# Patient Record
Sex: Female | Born: 1960 | Race: White | Hispanic: No | Marital: Single | Smoking: Current every day smoker
Health system: Southern US, Community
[De-identification: ages and names within clinical notes are randomized; demographics above are authoritative.]

## PROBLEM LIST (undated history)

## (undated) DIAGNOSIS — T8859XA Other complications of anesthesia, initial encounter: Secondary | ICD-10-CM

## (undated) DIAGNOSIS — Z9889 Other specified postprocedural states: Secondary | ICD-10-CM

## (undated) DIAGNOSIS — M1990 Unspecified osteoarthritis, unspecified site: Secondary | ICD-10-CM

## (undated) DIAGNOSIS — Z8489 Family history of other specified conditions: Secondary | ICD-10-CM

## (undated) DIAGNOSIS — C801 Malignant (primary) neoplasm, unspecified: Secondary | ICD-10-CM

## (undated) DIAGNOSIS — I10 Essential (primary) hypertension: Secondary | ICD-10-CM

## (undated) HISTORY — PX: ABDOMINAL HYSTERECTOMY: SHX81

## (undated) HISTORY — PX: ROTATOR CUFF REPAIR: SHX139

---

## 1998-06-12 ENCOUNTER — Ambulatory Visit (HOSPITAL_COMMUNITY): Admission: RE | Admit: 1998-06-12 | Discharge: 1998-06-12 | Payer: Self-pay | Admitting: Urology

## 1999-03-06 ENCOUNTER — Other Ambulatory Visit: Admission: RE | Admit: 1999-03-06 | Discharge: 1999-03-06 | Payer: Self-pay | Admitting: Obstetrics and Gynecology

## 1999-10-08 ENCOUNTER — Other Ambulatory Visit: Admission: RE | Admit: 1999-10-08 | Discharge: 1999-10-08 | Payer: Self-pay | Admitting: Obstetrics and Gynecology

## 2000-04-11 ENCOUNTER — Emergency Department (HOSPITAL_COMMUNITY): Admission: EM | Admit: 2000-04-11 | Discharge: 2000-04-11 | Payer: Self-pay | Admitting: Emergency Medicine

## 2000-04-11 ENCOUNTER — Encounter: Payer: Self-pay | Admitting: Emergency Medicine

## 2000-10-11 ENCOUNTER — Other Ambulatory Visit: Admission: RE | Admit: 2000-10-11 | Discharge: 2000-10-11 | Payer: Self-pay | Admitting: Obstetrics and Gynecology

## 2001-11-17 ENCOUNTER — Other Ambulatory Visit: Admission: RE | Admit: 2001-11-17 | Discharge: 2001-11-17 | Payer: Self-pay | Admitting: Obstetrics and Gynecology

## 2002-06-15 ENCOUNTER — Encounter: Payer: Self-pay | Admitting: Emergency Medicine

## 2002-06-15 ENCOUNTER — Emergency Department (HOSPITAL_COMMUNITY): Admission: EM | Admit: 2002-06-15 | Discharge: 2002-06-15 | Payer: Self-pay | Admitting: Emergency Medicine

## 2002-12-05 ENCOUNTER — Other Ambulatory Visit: Admission: RE | Admit: 2002-12-05 | Discharge: 2002-12-05 | Payer: Self-pay | Admitting: Obstetrics and Gynecology

## 2005-09-21 ENCOUNTER — Other Ambulatory Visit: Admission: RE | Admit: 2005-09-21 | Discharge: 2005-09-21 | Payer: Self-pay | Admitting: Family Medicine

## 2006-08-23 ENCOUNTER — Ambulatory Visit: Payer: Self-pay | Admitting: Family Medicine

## 2006-12-13 ENCOUNTER — Ambulatory Visit: Payer: Self-pay | Admitting: Family Medicine

## 2007-12-02 ENCOUNTER — Emergency Department (HOSPITAL_COMMUNITY): Admission: EM | Admit: 2007-12-02 | Discharge: 2007-12-02 | Payer: Self-pay | Admitting: Emergency Medicine

## 2008-10-02 ENCOUNTER — Ambulatory Visit: Payer: Self-pay | Admitting: Family Medicine

## 2009-07-03 ENCOUNTER — Ambulatory Visit: Payer: Self-pay | Admitting: Family Medicine

## 2009-08-19 ENCOUNTER — Encounter: Admission: RE | Admit: 2009-08-19 | Discharge: 2009-08-19 | Payer: Self-pay | Admitting: Family Medicine

## 2009-08-19 ENCOUNTER — Ambulatory Visit: Payer: Self-pay | Admitting: Family Medicine

## 2010-01-03 IMAGING — CR DG CHEST 2V
2 series · 2 of 2 positions shown · non-contrast
Comparison: None.

CLINICAL DATA: Cough and fever.

CHEST - 2 VIEW

[w chest pa *]
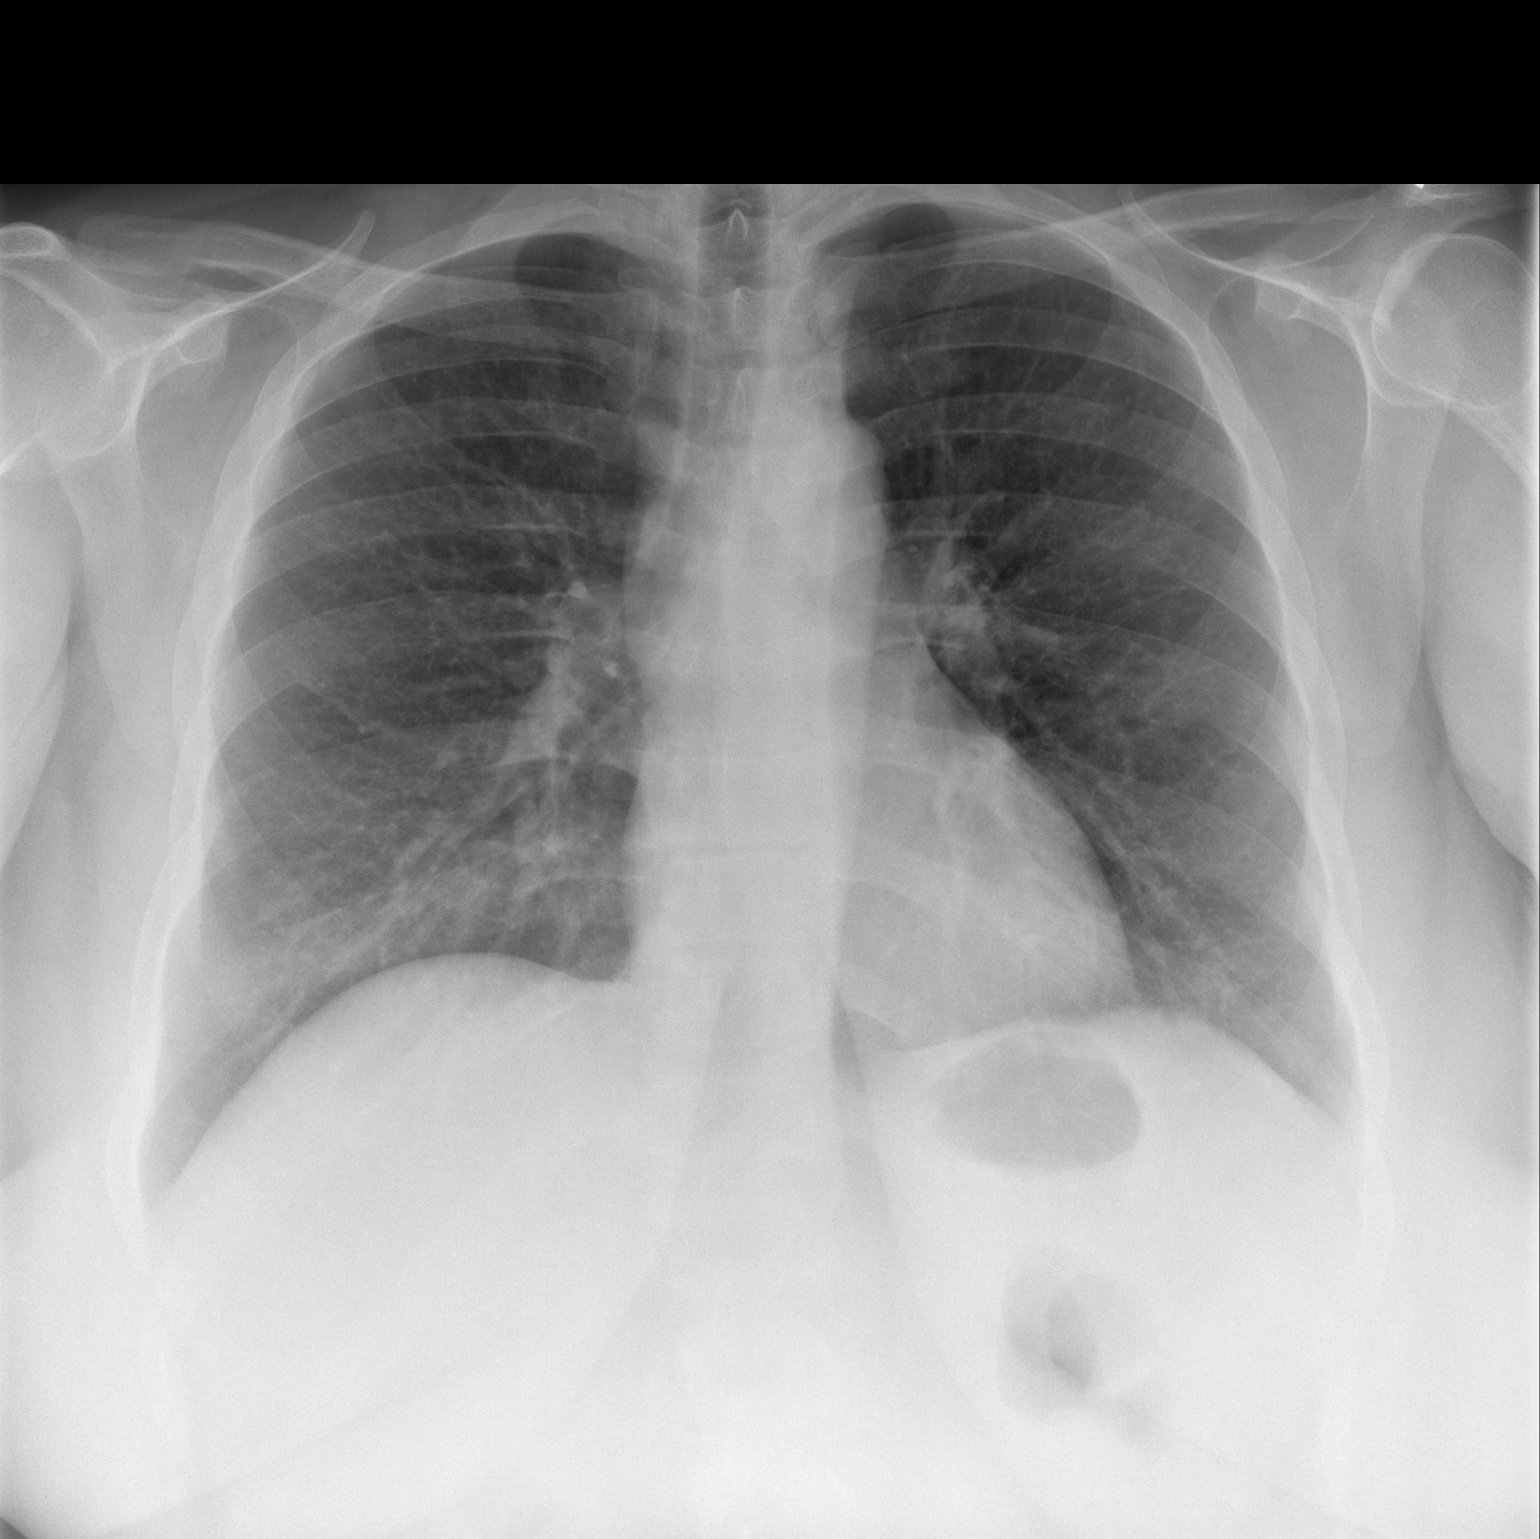

[w chest lat]
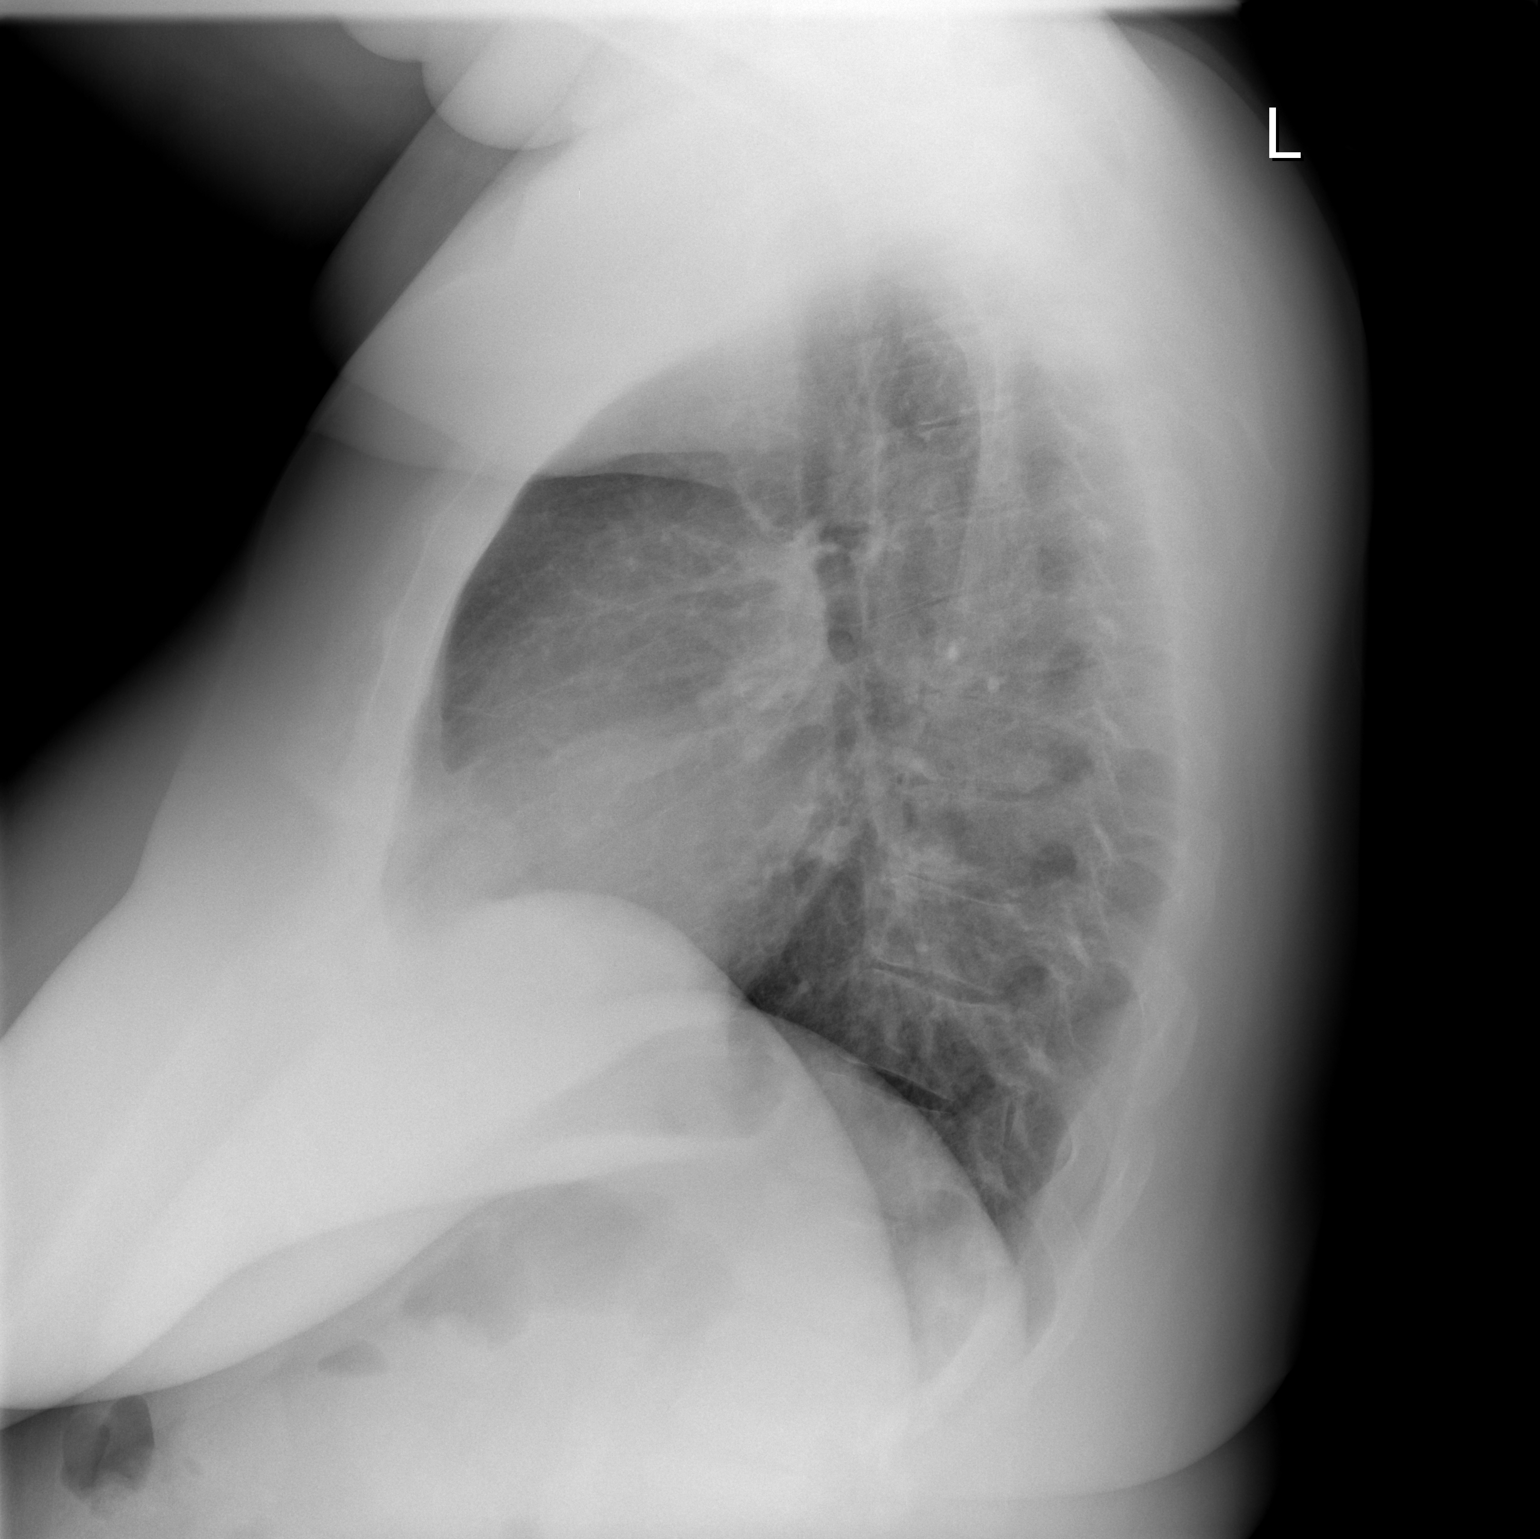

[2 of 2 positions shown; findings below may reference images not displayed]

FINDINGS: The lungs are clear without focal infiltrate, edema,
pneumothorax or pleural effusion. Interstitial markings are
diffusely coarsened with chronic features. The cardiopericardial
silhouette is within normal limits for size. Imaged bony structures
of the thorax are intact.
IMPRESSION: No acute cardiopulmonary process.

## 2011-01-19 ENCOUNTER — Encounter: Payer: Self-pay | Admitting: Family Medicine

## 2013-06-13 ENCOUNTER — Ambulatory Visit (INDEPENDENT_AMBULATORY_CARE_PROVIDER_SITE_OTHER): Payer: Self-pay | Admitting: Medical

## 2013-06-13 ENCOUNTER — Encounter: Payer: Self-pay | Admitting: Medical

## 2013-06-13 VITALS — BP 120/70 | HR 92 | Temp 98.4°F | Resp 18 | Wt 302.0 lb

## 2013-06-13 DIAGNOSIS — J029 Acute pharyngitis, unspecified: Secondary | ICD-10-CM

## 2013-06-13 DIAGNOSIS — E669 Obesity, unspecified: Secondary | ICD-10-CM

## 2013-06-13 DIAGNOSIS — B370 Candidal stomatitis: Secondary | ICD-10-CM

## 2013-06-13 MED ORDER — FLUCONAZOLE 100 MG PO TABS: 100.0000 mg | ORAL_TABLET | Freq: Every day | ORAL | Status: DC

## 2013-06-13 NOTE — Progress Notes (Signed)
Subjective: Here for 1 day hx/o blisters on tongue, white patches on back of throat, lymph nodes in neck tender.  denies sick contacts, no fever, no cough, no runny nose, no sneezing, no ear pain, no rash.   Denies hx/o HIV, diabetes, and says she does check her glucose every now and then with mother's glucometer and gets normal numbers.   Tongue feels like a burning sensation.   Using salt water gargles.  Objective: Filed Vitals:   06/13/13 1431  BP: 120/70  Pulse: 92  Temp: 98.4 F (36.9 C)  Resp: 18    General appearance: alert, no distress, WD/WN, obese white female HEENT: normocephalic, sclerae anicteric, TMs pearly, nares patent, no discharge or erythema Oral cavity: MMM, +tongue and pharynx with white patches, tender and somewhat erythematous tongue, all suggestive of thrush Neck: supple, no lymphadenopathy, no thyromegaly, no masses  Assessment: Encounter Diagnoses  Name Primary?  Tara Gregory Yes  . Pharyngitis   . Obesity, unspecified      Plan: discussed concern for underlying causes of thrush.  Recommended some baseline testing but she declines . Begin diflucan, can use salt water gargles, and if not improved in 1 wk then f/u.  In general advised she return for baseline testing to screen for HIV, diabetes, and underlying risks for thrush.

## 2013-06-14 ENCOUNTER — Telehealth: Payer: Self-pay | Admitting: Family Medicine

## 2013-06-14 LAB — POCT RAPID STREP A (OFFICE): Rapid Strep A Screen: NEGATIVE

## 2013-06-14 NOTE — Telephone Encounter (Signed)
I called and I talk with the patient and she was on her way to the pharmacy to pick up her medication. CLS

## 2013-06-14 NOTE — Telephone Encounter (Signed)
Message copied by Janeice Robinson on Wed Jun 14, 2013 11:11 AM ------      Message from: Jac Canavan      Created: Tue Jun 13, 2013  9:59 PM       Call and make sure pt got the diflucan ------

## 2013-11-20 ENCOUNTER — Other Ambulatory Visit (INDEPENDENT_AMBULATORY_CARE_PROVIDER_SITE_OTHER): Payer: Self-pay

## 2013-11-20 DIAGNOSIS — Z111 Encounter for screening for respiratory tuberculosis: Secondary | ICD-10-CM

## 2013-11-22 LAB — TB SKIN TEST
Induration: 0 mm
TB Skin Test: NEGATIVE

## 2023-03-10 ENCOUNTER — Other Ambulatory Visit: Payer: Self-pay

## 2023-03-10 ENCOUNTER — Encounter (HOSPITAL_BASED_OUTPATIENT_CLINIC_OR_DEPARTMENT_OTHER): Payer: Self-pay | Admitting: Emergency Medicine

## 2023-03-10 ENCOUNTER — Emergency Department (HOSPITAL_BASED_OUTPATIENT_CLINIC_OR_DEPARTMENT_OTHER)
Admission: EM | Admit: 2023-03-10 | Discharge: 2023-03-10 | Disposition: A | Discharge status: Home / Self Care | Payer: Self-pay | Attending: Emergency Medicine | Admitting: Emergency Medicine

## 2023-03-10 DIAGNOSIS — H1033 Unspecified acute conjunctivitis, bilateral: Secondary | ICD-10-CM | POA: Insufficient documentation

## 2023-03-10 MED ORDER — FLUORESCEIN SODIUM 1 MG OP STRP
1.0000 | ORAL_STRIP | Freq: Once | OPHTHALMIC | Status: AC
  Administered 2023-03-10: 1 via OPHTHALMIC
  Filled 2023-03-10: qty 1

## 2023-03-10 MED ORDER — ERYTHROMYCIN 5 MG/GM OP OINT
TOPICAL_OINTMENT | Freq: Four times a day (QID) | OPHTHALMIC | Status: DC
  Filled 2023-03-10: qty 3.5

## 2023-03-10 MED ORDER — TETRACAINE HCL 0.5 % OP SOLN
1.0000 [drp] | Freq: Once | OPHTHALMIC | Status: AC
  Administered 2023-03-10: 1 [drp] via OPHTHALMIC
  Filled 2023-03-10: qty 4

## 2023-03-10 MED ORDER — LEVOCETIRIZINE DIHYDROCHLORIDE 5 MG PO TABS: 5.0000 mg | ORAL_TABLET | Freq: Every evening | ORAL | 0 refills | Status: DC

## 2023-03-10 NOTE — ED Triage Notes (Signed)
Pt via pov from home with what appears to be conjunctivitis. She has bilateral red sclera with discharge x 3 weeks, progressively worsening. She states she has been using allergy drops and it seems to get better then worse again. She reports the last 2 days have been the worst. Pt alert & oriented, nad noted.

## 2023-03-10 NOTE — ED Notes (Signed)
Reviewed AVS/discharge instruction with patient. Time allotted for and all questions answered. Patient is agreeable for d/c and escorted to ed exit by staff.  

## 2023-03-10 NOTE — Discharge Instructions (Signed)
Seen today for eye redness and pain.  You do have some decreased vision in the right.  We have given you some antibiotic drops.  Please call the eye doctor today to get close follow-up appointment and more detailed eye exam.  There is no sign of a corneal abrasion or foreign body on your exam.  Come back to the ER for any new or worsening symptoms.

## 2023-03-10 NOTE — ED Provider Notes (Signed)
[facility] Provider Note   CSN: [id number] Arrival date & time: 03/10/23  1403     History  Chief Complaint  Patient presents with   Eye Pain    Tara Gregory is a 62 y.o. female.  She has history of seasonal allergies but denies other past medical history. She presents today for bilateral eye redness for 3 weeks with watery drainage.  She comes in today because she states that she had been managing her symptoms with artificial tears and allergy drops but now she has a gritty feeling in the right eye with some sensitivity to light and had some white drainage from the right eye today as well.  She denies any vision changes.  She does not wear contact lenses, reading glasses only.  She has not seen an eye doctor or other provider for this problem.  She denies fevers or chills.  Eye Pain       Home Medications Prior to Admission medications   Medication Sig Start Date End Date Taking? Authorizing Provider  levocetirizine (XYZAL) 5 MG tablet Take 1 tablet (5 mg total) by mouth every evening. 03/10/23  Yes Quandarius Nill A, PA-C  fluconazole (DIFLUCAN) 100 MG tablet Take 1 tablet (100 mg total) by mouth daily. 06/13/13   Tysinger, Camelia Eng, PA-C      Allergies    Cortisone, Demerol [meperidine hcl], Grapefruit concentrate, and Penicillins    Review of Systems   Review of Systems  Eyes:  Positive for pain.    Physical Exam Updated Vital Signs BP (!) 153/82 (BP Location: Right Arm)   Pulse 66   Temp 98 F (36.7 C) (Oral)   Resp 18   Ht '5\' 10"'$  (1.778 m)   Wt 95.3 kg   SpO2 100%   BMI 30.13 kg/m  Physical Exam Vitals and nursing note reviewed.  Constitutional:      General: She is not in acute distress.    Appearance: She is well-developed.  HENT:     Head: Normocephalic and atraumatic.     Mouth/Throat:     Mouth: Mucous membranes are moist.  Eyes:     General: Lids are everted, no foreign bodies appreciated. No visual  field deficit.       Right eye: No foreign body, discharge or hordeolum.        Left eye: No foreign body, discharge or hordeolum.     Intraocular pressure: Right eye pressure is 20 mmHg. Left eye pressure is 18 mmHg.     Extraocular Movements:     Right eye: Normal extraocular motion and no nystagmus.     Left eye: Normal extraocular motion and no nystagmus.     Conjunctiva/sclera:     Right eye: Right conjunctiva is injected. No chemosis, exudate or hemorrhage.    Left eye: Left conjunctiva is injected. No chemosis, exudate or hemorrhage.    Pupils: Pupils are equal, round, and reactive to light.     Slit lamp exam:    Right eye: Photophobia present.     Left eye: No photophobia.     Comments: Woods lamp used with fluorescein stain. No FB or abrasion of cornea, no ulcer or dendritic lesions  Cardiovascular:     Rate and Rhythm: Normal rate and regular rhythm.     Heart sounds: No murmur heard. Pulmonary:     Effort: Pulmonary effort is normal. No respiratory distress.     Breath sounds: Normal breath sounds.  Abdominal:     Palpations: Abdomen is soft.     Tenderness: There is no abdominal tenderness.  Musculoskeletal:        General: No swelling.     Cervical back: Neck supple.  Skin:    General: Skin is warm and dry.     Capillary Refill: Capillary refill takes less than 2 seconds.  Neurological:     Mental Status: She is alert.  Psychiatric:        Mood and Affect: Mood normal.     ED Results / Procedures / Treatments   Labs (all labs ordered are listed, but only abnormal results are displayed) Labs Reviewed - No data to display  EKG None  Radiology No results found.  Procedures Procedures    Medications Ordered in ED Medications  fluorescein ophthalmic strip 1 strip (has no administration in time range)  tetracaine (PONTOCAINE) 0.5 % ophthalmic solution 1 drop (has no administration in time range)  erythromycin ophthalmic ointment (has no administration  in time range)    ED Course/ Medical Decision Making/ A&P                             Medical Decision Making This patient presents to the ED for concern of bilateral eye redness with drainage from right eye and right eye "gritty sensation" worsening X 3 weeks this involves an extensive number of treatment options, and is a complaint that carries with it a high risk of complications and morbidity.  The differential diagnosis includes conjunctivitis,         Problem List / ED Course / Critical interventions / Medication management  Bilateral eye redness with gritty sensation in the right eye going on for 3 weeks getting worse not resolved with OTC allergy drops.  Fluorescein stain did not show foreign body, abrasion, dendritic lesion or ulceration.  Patient does not wear contact lenses or corrective lenses, has not seen an eye doctor in a long time.  I suspect patient has allergic conjunctivitis that has worsened and now has some superimposed bacterial conjunctivitis.  Prescribed oral antihistamines, advised on ophthalmology follow-up.  She states she prefers to see her daughters ophthalmologist Dr. Katy Fitch, she was given his contact information and advised on close follow-up.  Of note she had normal eye pressures, do not suspect glaucoma, visual acuity is slightly decreased in the right eye.  She does not have consensual photophobia to suggest that she has acute iritis.  I ordered medication including tetracaine for topical anesthesia Reevaluation of the patient after these medicines showed that the patient resolved I have reviewed the patients home medicines and have made adjustments as needed       Risk Prescription drug management.           Final Clinical Impression(s) / ED Diagnoses Final diagnoses:  Acute conjunctivitis of both eyes, unspecified acute conjunctivitis type    Rx / DC Orders ED Discharge Orders          Ordered    levocetirizine (XYZAL) 5 MG tablet   Every evening        03/10/23 1618              Gwenevere Abbot, PA-C 03/10/23 1621    Jeanell Sparrow, DO 03/11/23 0031    Jeanell Sparrow, DO 03/11/23 0032

## 2024-05-19 NOTE — Progress Notes (Signed)
 Surgery orders requested via Epic inbox.

## 2024-05-24 NOTE — H&P (Signed)
 Patient's anticipated LOS is less than 2 midnights, meeting these requirements: - Younger than 34 - Lives within 1 hour of care - Has a competent adult at home to recover with post-op recover - NO history of  - Chronic pain requiring opiods  - Diabetes  - Coronary Artery Disease  - Heart failure  - Heart attack  - Stroke  - DVT/VTE  - Cardiac arrhythmia  - Respiratory Failure/COPD  - Renal failure  - Anemia  - Advanced Liver disease     Tara Gregory is an 63 y.o. female.    Chief Complaint: right shoulder pain  HPI: Pt is a 63 y.o. female complaining of right shoulder pain for multiple years. Pain had continually increased since the beginning. X-rays in the clinic show end-stage arthritic changes of the right shoulder. Pt has tried various conservative treatments which have failed to alleviate their symptoms, including injections and therapy. Various options are discussed with the patient. Risks, benefits and expectations were discussed with the patient. Patient understand the risks, benefits and expectations and wishes to proceed with surgery.   PCP:  No primary care provider on file.  D/C Plans: Home  PMH: No past medical history on file.  PSH: Past Surgical History:  Procedure Laterality Date   ABDOMINAL HYSTERECTOMY      Social History:  reports that she has been smoking cigarettes. She does not have any smokeless tobacco history on file. She reports that she does not currently use alcohol. No history on file for drug use. BMI: Estimated body mass index is 30.13 kg/m as calculated from the following:   Height as of 03/10/23: 5\' 10"  (1.778 m).   Weight as of 03/10/23: 95.3 kg.  No results found for: "ALBUMIN" Diabetes: Patient does not have a diagnosis of diabetes.     Smoking Status:   reports that she has been smoking cigarettes. She does not have any smokeless tobacco history on file.    Allergies:  Allergies  Allergen Reactions   Cortisone      Cortisone shot - throat swollen up   Demerol [Meperidine Hcl] Nausea And Vomiting    Severe vomiting   Grapefruit Concentrate     Grapefruits - hives   Penicillins Hives    Medications: No current facility-administered medications for this encounter.   Current Outpatient Medications  Medication Sig Dispense Refill   acetaminophen (TYLENOL) 650 MG CR tablet Take 650-1,300 mg by mouth every 8 (eight) hours as needed for pain.     amLODipine (NORVASC) 5 MG tablet Take 5 mg by mouth in the morning.     Ascorbic Acid (VITAMIN C PO) Take 1,000 mg by mouth in the morning and at bedtime.     cetirizine (ZYRTEC) 10 MG tablet Take 10 mg by mouth at bedtime.     methocarbamol (ROBAXIN) 500 MG tablet Take 500 mg by mouth 2 (two) times daily as needed for muscle spasms.     Olopatadine HCl (PATADAY OP) Place 1 drop into both eyes at bedtime.     Povidone, PF, (IVIZIA DRY EYES) 0.5 % SOLN Place 1 drop into both eyes in the morning, at noon, in the evening, and at bedtime.     rosuvastatin (CRESTOR) 5 MG tablet Take 5 mg by mouth every evening.     traMADol (ULTRAM) 50 MG tablet Take 25 mg by mouth daily as needed (pain).      No results found for this or any previous visit (from the past 48  hours). No results found.  ROS: Pain with rom of the right upper extremity  Physical Exam: Alert and oriented 63 y.o. female in no acute distress Cranial nerves 2-12 intact Cervical spine: full rom with no tenderness, nv intact distally Chest: active breath sounds bilaterally, no wheeze rhonchi or rales Heart: regular rate and rhythm, no murmur Abd: non tender non distended with active bowel sounds Hip is stable with rom  Right shoulder painful and weak rom Nv intact distally No rashes or edema distally  Assessment/Plan Assessment: right shoulder cuff arthropathy  Plan:  Patient will undergo a right reverse total shoulder by Dr. Brunilda Capra at [place] Risks benefits and expectations were discussed with  the patient. Patient understand risks, benefits and expectations and wishes to proceed. Preoperative templating of the joint replacement has been completed, documented, and submitted to the Operating Room personnel in order to optimize intra-operative equipment management.   Lorina Roosevelt PA-C, [facility] is now [employer] [address], [address], [place], [place] [address] Phone: [phone] [email or web address] Facebook  [employer]

## 2024-05-25 NOTE — Patient Instructions (Signed)
 SURGICAL WAITING ROOM VISITATION  Patients having surgery or a procedure may have no more than 2 support people in the waiting area - these visitors may rotate.    Children under the age of 63 must have an adult with them who is not the patient.  Due to an increase in RSV and influenza rates and associated hospitalizations, children ages 65 and under may not visit patients in [facility] hospitals.  Visitors with respiratory illnesses are discouraged from visiting and should remain at home.  If the patient needs to stay at the hospital during part of their recovery, the visitor guidelines for inpatient rooms apply. Pre-op nurse will coordinate an appropriate time for 1 support person to accompany patient in pre-op.  This support person may not rotate.    Please refer to the [facility] website for the visitor guidelines for Inpatients (after your surgery is over and you are in a regular room).       Your procedure is scheduled on: 06/01/24   Report to [facility] Main Entrance    Report to admitting at : 5:15 AM   Call this number if you have problems the morning of surgery [phone]   Do not eat food :After Midnight.   After Midnight you may have the following liquids until : 4:30 AM DAY OF SURGERY  Water Non-Citrus Juices (without pulp, NO RED-Apple, White grape, White cranberry) Black Coffee (NO MILK/CREAM OR CREAMERS, sugar ok)  Clear Tea (NO MILK/CREAM OR CREAMERS, sugar ok) regular and decaf                             Plain Jell-O (NO RED)                                           Fruit ices (not with fruit pulp, NO RED)                                     Popsicles (NO RED)                                                               Sports drinks like Gatorade (NO RED)   The day of surgery:  Drink ONE (1) Pre-Surgery Clear Ensure at : 4:30 AM the morning of surgery. Drink in one sitting. Do not sip.  This drink was given to you during your hospital  pre-op  appointment visit. Nothing else to drink after completing the  Pre-Surgery Clear Ensure or G2.          If you have questions, please contact your surgeon's office.  FOLLOW ANY ADDITIONAL PRE OP INSTRUCTIONS YOU RECEIVED FROM YOUR SURGEON'S OFFICE!!!   Oral Hygiene is also important to reduce your risk of infection.                                    Remember - BRUSH YOUR TEETH THE MORNING OF SURGERY WITH YOUR REGULAR TOOTHPASTE  DENTURES WILL BE REMOVED PRIOR TO SURGERY PLEASE DO NOT APPLY "Poly grip" OR ADHESIVES!!!   Do NOT smoke after Midnight   Stop all vitamins and herbal supplements 7 days before surgery.   Take these medicines the morning of surgery with A SIP OF WATER: amlodipine.Tylenol as needed.                              You may not have any metal on your body including hair pins, jewelry, and body piercing             Do not wear make-up, lotions, powders, perfumes/cologne, or deodorant  Do not wear nail polish including gel and S&S, artificial/acrylic nails, or any other type of covering on natural nails including finger and toenails. If you have artificial nails, gel coating, etc. that needs to be removed by a nail salon please have this removed prior to surgery or surgery may need to be canceled/ delayed if the surgeon/ anesthesia feels like they are unable to be safely monitored.   Do not shave  48 hours prior to surgery.    Do not bring valuables to the hospital. [facility] IS NOT             RESPONSIBLE   FOR VALUABLES.   Contacts, glasses, dentures or bridgework may not be worn into surgery.   Bring small overnight bag day of surgery.   DO NOT BRING YOUR HOME MEDICATIONS TO THE HOSPITAL. PHARMACY WILL DISPENSE MEDICATIONS LISTED ON YOUR MEDICATION LIST TO YOU DURING YOUR ADMISSION IN THE HOSPITAL!    Patients discharged on the day of surgery will not be allowed to drive home.  Someone NEEDS to stay with you for the first 24 hours after  anesthesia.   Special Instructions: Bring a copy of your healthcare power of attorney and living will documents the day of surgery if you haven't scanned them before.              Please read over the following fact sheets you were given: IF YOU HAVE QUESTIONS ABOUT YOUR PRE-OP INSTRUCTIONS PLEASE CALL [phone]   If you received a COVID test during your pre-op visit  it is requested that you wear a mask when out in public, stay away from anyone that may not be feeling well and notify your surgeon if you develop symptoms. If you test positive for Covid or have been in contact with anyone that has tested positive in the last 10 days please notify you surgeon. [facility]- Preparing for Total Shoulder Arthroplasty    Before surgery, you can play an important role. Because skin is not sterile, your skin needs to be as free of germs as possible. You can reduce the number of germs on your skin by using the following products. Benzoyl Peroxide Gel Reduces the number of germs present on the skin Applied twice a day to shoulder area starting two days before surgery    ==================================================================  Please follow these instructions carefully:  BENZOYL PEROXIDE 5% GEL  Please do not use if you have an allergy to benzoyl peroxide.   If your skin becomes reddened/irritated stop using the benzoyl peroxide.  Starting two days before surgery, apply as follows: Apply benzoyl peroxide in the morning and at night. Apply after taking a shower. If you are not taking a shower clean entire shoulder front, back, and side along with the armpit with a clean wet washcloth.  Place a quarter-sized dollop on your shoulder and rub in thoroughly, making sure to cover the front, back, and side of your shoulder, along with the armpit.   2 days before ____ AM   ____ PM              1 day before ____ AM   ____ PM                         Do this twice a day for two days.  (Last  application is the night before surgery, AFTER using the CHG soap as described below).  Do NOT apply benzoyl peroxide gel on the day of surgery.     Pre-operative 5 CHG Bath Instructions   You can play a key role in reducing the risk of infection after surgery. Your skin needs to be as free of germs as possible. You can reduce the number of germs on your skin by washing with CHG (chlorhexidine gluconate) soap before surgery. CHG is an antiseptic soap that kills germs and continues to kill germs even after washing.   DO NOT use if you have an allergy to chlorhexidine/CHG or antibacterial soaps. If your skin becomes reddened or irritated, stop using the CHG and notify one of our RNs at [phone].   Please shower with the CHG soap starting 4 days before surgery using the following schedule:     Please keep in mind the following:  DO NOT shave, including legs and underarms, starting the day of your first shower.   You may shave your face at any point before/day of surgery.  Place clean sheets on your bed the day you start using CHG soap. Use a clean washcloth (not used since being washed) for each shower. DO NOT sleep with pets once you start using the CHG.   CHG Shower Instructions:  If you choose to wash your hair and private area, wash first with your normal shampoo/soap.  After you use shampoo/soap, rinse your hair and body thoroughly to remove shampoo/soap residue.  Turn the water OFF and apply about 3 tablespoons (45 ml) of CHG soap to a CLEAN washcloth.  Apply CHG soap ONLY FROM YOUR NECK DOWN TO YOUR TOES (washing for 3-5 minutes)  DO NOT use CHG soap on face, private areas, open wounds, or sores.  Pay special attention to the area where your surgery is being performed.  If you are having back surgery, having someone wash your back for you may be helpful. Wait 2 minutes after CHG soap is applied, then you may rinse off the CHG soap.  Pat dry with a clean towel  Put on clean  clothes/pajamas   If you choose to wear lotion, please use ONLY the CHG-compatible lotions on the back of this paper.     Additional instructions for the day of surgery: DO NOT APPLY any lotions, deodorants, cologne, or perfumes.   Put on clean/comfortable clothes.  Brush your teeth.  Ask your nurse before applying any prescription medications to the skin.   CHG Compatible Lotions   Aveeno Moisturizing lotion  Cetaphil Moisturizing Cream  Cetaphil Moisturizing Lotion  Clairol Herbal Essence Moisturizing Lotion, Dry Skin  Clairol Herbal Essence Moisturizing Lotion, Extra Dry Skin  Clairol Herbal Essence Moisturizing Lotion, Normal Skin  Curel Age Defying Therapeutic Moisturizing Lotion with Alpha Hydroxy  Curel Extreme Care Body Lotion  Curel Soothing Hands Moisturizing Hand Lotion  Curel Therapeutic Moisturizing Cream, Fragrance-Free  Curel Therapeutic  Moisturizing Lotion, Fragrance-Free  Curel Therapeutic Moisturizing Lotion, Original Formula  Eucerin Daily Replenishing Lotion  Eucerin Dry Skin Therapy Plus Alpha Hydroxy Crme  Eucerin Dry Skin Therapy Plus Alpha Hydroxy Lotion  Eucerin Original Crme  Eucerin Original Lotion  Eucerin Plus Crme Eucerin Plus Lotion  Eucerin TriLipid Replenishing Lotion  Keri Anti-Bacterial Hand Lotion  Keri Deep Conditioning Original Lotion Dry Skin Formula Softly Scented  Keri Deep Conditioning Original Lotion, Fragrance Free Sensitive Skin Formula  Keri Lotion Fast Absorbing Fragrance Free Sensitive Skin Formula  Keri Lotion Fast Absorbing Softly Scented Dry Skin Formula  Keri Original Lotion  Keri Skin Renewal Lotion Keri Silky Smooth Lotion  Keri Silky Smooth Sensitive Skin Lotion  Nivea Body Creamy Conditioning Oil  Nivea Body Extra Enriched Lotion  Nivea Body Original Lotion  Nivea Body Sheer Moisturizing Lotion Nivea Crme  Nivea Skin Firming Lotion  NutraDerm 30 Skin Lotion  NutraDerm Skin Lotion  NutraDerm Therapeutic Skin  Cream  NutraDerm Therapeutic Skin Lotion  ProShield Protective Hand Cream  Provon moisturizing lotion   Incentive Spirometer  An incentive spirometer is a tool that can help keep your lungs clear and active. This tool measures how well you are filling your lungs with each breath. Taking long deep breaths may help reverse or decrease the chance of developing breathing (pulmonary) problems (especially infection) following: A long period of time when you are unable to move or be active. BEFORE THE PROCEDURE  If the spirometer includes an indicator to show your best effort, your nurse or respiratory therapist will set it to a desired goal. If possible, sit up straight or lean slightly forward. Try not to slouch. Hold the incentive spirometer in an upright position. INSTRUCTIONS FOR USE  Sit on the edge of your bed if possible, or sit up as far as you can in bed or on a chair. Hold the incentive spirometer in an upright position. Breathe out normally. Place the mouthpiece in your mouth and seal your lips tightly around it. Breathe in slowly and as deeply as possible, raising the piston or the ball toward the top of the column. Hold your breath for 3-5 seconds or for as long as possible. Allow the piston or ball to fall to the bottom of the column. Remove the mouthpiece from your mouth and breathe out normally. Rest for a few seconds and repeat Steps 1 through 7 at least 10 times every 1-2 hours when you are awake. Take your time and take a few normal breaths between deep breaths. The spirometer may include an indicator to show your best effort. Use the indicator as a goal to work toward during each repetition. After each set of 10 deep breaths, practice coughing to be sure your lungs are clear. If you have an incision (the cut made at the time of surgery), support your incision when coughing by placing a pillow or rolled up towels firmly against it. Once you are able to get out of bed, walk around  indoors and cough well. You may stop using the incentive spirometer when instructed by your caregiver.  RISKS AND COMPLICATIONS Take your time so you do not get dizzy or light-headed. If you are in pain, you may need to take or ask for pain medication before doing incentive spirometry. It is harder to take a deep breath if you are having pain. AFTER USE Rest and breathe slowly and easily. It can be helpful to keep track of a log of your progress. Your caregiver can  provide you with a simple table to help with this. If you are using the spirometer at home, follow these instructions: SEEK MEDICAL CARE IF:  You are having difficultly using the spirometer. You have trouble using the spirometer as often as instructed. Your pain medication is not giving enough relief while using the spirometer. You develop fever of 100.5 F (38.1 C) or higher. SEEK IMMEDIATE MEDICAL CARE IF:  You cough up bloody sputum that had not been present before. You develop fever of 102 F (38.9 C) or greater. You develop worsening pain at or near the incision site. MAKE SURE YOU:  Understand these instructions. Will watch your condition. Will get help right away if you are not doing well or get worse. Document Released: 04/26/2007 Document Revised: 03/07/2012 Document Reviewed: 06/27/2007 [facility] Patient Information 2014 [place], [place].   ________________________________________________________________________

## 2024-05-26 ENCOUNTER — Encounter (HOSPITAL_COMMUNITY): Payer: Self-pay

## 2024-05-26 ENCOUNTER — Other Ambulatory Visit: Payer: Self-pay

## 2024-05-26 ENCOUNTER — Encounter (HOSPITAL_COMMUNITY)
Admission: RE | Admit: 2024-05-26 | Discharge: 2024-05-26 | Disposition: A | Discharge status: Home / Self Care | Payer: Self-pay | Source: Ambulatory Visit | Attending: Orthopedic Surgery | Admitting: Orthopedic Surgery

## 2024-05-26 VITALS — BP 159/93 | HR 65 | Temp 97.9°F | Ht 68.0 in | Wt 302.0 lb

## 2024-05-26 DIAGNOSIS — R9431 Abnormal electrocardiogram [ECG] [EKG]: Secondary | ICD-10-CM | POA: Insufficient documentation

## 2024-05-26 DIAGNOSIS — Z01818 Encounter for other preprocedural examination: Secondary | ICD-10-CM | POA: Insufficient documentation

## 2024-05-26 DIAGNOSIS — I10 Essential (primary) hypertension: Secondary | ICD-10-CM

## 2024-05-26 HISTORY — DX: Unspecified osteoarthritis, unspecified site: M19.90

## 2024-05-26 HISTORY — DX: Family history of other specified conditions: Z84.89

## 2024-05-26 HISTORY — DX: Essential (primary) hypertension: I10

## 2024-05-26 HISTORY — DX: Other complications of anesthesia, initial encounter: T88.59XA

## 2024-05-26 HISTORY — DX: Nausea with vomiting, unspecified: Z98.890

## 2024-05-26 HISTORY — DX: Malignant (primary) neoplasm, unspecified: C80.1

## 2024-05-26 LAB — SURGICAL PCR SCREEN
MRSA, PCR: NEGATIVE
Staphylococcus aureus: NEGATIVE

## 2024-05-26 LAB — BASIC METABOLIC PANEL WITH GFR
Anion gap: 6 (ref 5–15)
BUN: 16 mg/dL (ref 8–23)
CO2: 26 mmol/L (ref 22–32)
Calcium: 9 mg/dL (ref 8.9–10.3)
Chloride: 105 mmol/L (ref 98–111)
Creatinine, Ser: 0.79 mg/dL (ref 0.44–1.00)
GFR, Estimated: >60 mL/min (ref >60)
Glucose, Bld: 95 mg/dL (ref 70–99)
Potassium: 4.2 mmol/L (ref 3.5–5.1)
Sodium: 137 mmol/L (ref 135–145)

## 2024-05-26 LAB — CBC
HCT: 43.3 % (ref 36.0–46.0)
Hemoglobin: 14 g/dL (ref 12.0–15.0)
MCH: 31.1 pg (ref 26.0–34.0)
MCHC: 32.3 g/dL (ref 30.0–36.0)
MCV: 96.2 fL (ref 80.0–100.0)
Platelets: 293 10*3/uL (ref 150–400)
RBC: 4.5 MIL/uL (ref 3.87–5.11)
RDW: 13.7 % (ref 11.5–15.5)
WBC: 11.6 10*3/uL — ABNORMAL HIGH (ref 4.0–10.5)
nRBC: 0 % (ref 0.0–0.2)

## 2024-05-26 NOTE — Progress Notes (Signed)
 For Anesthesia: PCP - Karalee Oscar, PA  Cardiologist - N/A  Bowel Prep reminder:  Chest x-ray -  EKG - 05/26/24 Stress Test -  ECHO -  Cardiac Cath -  Pacemaker/ICD device last checked: Pacemaker orders received: Device Rep notified:  Spinal Cord Stimulator:N/A  Sleep Study - N/A CPAP -   Fasting Blood Sugar - N/A Checks Blood Sugar _____ times a day Date and result of last Hgb A1c-  Last dose of GLP1 agonist- N/A GLP1 instructions:   Last dose of SGLT-2 inhibitors- N/A SGLT-2 instructions:   Blood Thinner Instructions:N/A Aspirin Instructions: Last Dose:  Activity level: Can go up a flight of stairs and activities of daily living without stopping and without chest pain and/or shortness of breath   Able to exercise without chest pain and/or shortness of breath  Anesthesia review: Hx: HTN,Smoker.  Patient denies shortness of breath, fever, cough and chest pain at PAT appointment   Patient verbalized understanding of instructions that were given to them at the PAT appointment. Patient was also instructed that they will need to review over the PAT instructions again at home before surgery.

## 2024-05-26 NOTE — Progress Notes (Signed)
   05/26/24 0802  OBSTRUCTIVE SLEEP APNEA  Have you ever been diagnosed with sleep apnea through a sleep study? No  Do you snore loudly (loud enough to be heard through closed doors)?  1  Do you often feel tired, fatigued, or sleepy during the daytime (such as falling asleep during driving or talking to someone)? 1  Has anyone observed you stop breathing during your sleep? 0  Do you have, or are you being treated for high blood pressure? 1  BMI more than 35 kg/m2? 1  Age > 50 (1-yes) 1  Female Gender (Yes=1) 0  Obstructive Sleep Apnea Score 5  Score 5 or greater  Results sent to PCP

## 2024-06-02 ENCOUNTER — Other Ambulatory Visit: Payer: Self-pay

## 2024-06-02 ENCOUNTER — Ambulatory Visit (HOSPITAL_COMMUNITY): Payer: Self-pay | Admitting: Certified Registered"

## 2024-06-02 ENCOUNTER — Ambulatory Visit (HOSPITAL_COMMUNITY): Payer: Self-pay

## 2024-06-02 ENCOUNTER — Encounter (HOSPITAL_COMMUNITY)
Admission: RE | Disposition: A | Discharge status: Home / Self Care | Payer: Self-pay | Source: Home / Self Care | Attending: Orthopedic Surgery

## 2024-06-02 ENCOUNTER — Encounter (HOSPITAL_COMMUNITY): Payer: Self-pay | Admitting: Orthopedic Surgery

## 2024-06-02 ENCOUNTER — Ambulatory Visit (HOSPITAL_COMMUNITY)
Admission: RE | Admit: 2024-06-02 | Discharge: 2024-06-02 | Disposition: A | Discharge status: Home / Self Care | Payer: Self-pay | Attending: Orthopedic Surgery | Admitting: Orthopedic Surgery

## 2024-06-02 DIAGNOSIS — Z79899 Other long term (current) drug therapy: Secondary | ICD-10-CM | POA: Insufficient documentation

## 2024-06-02 DIAGNOSIS — M75101 Unspecified rotator cuff tear or rupture of right shoulder, not specified as traumatic: Secondary | ICD-10-CM | POA: Insufficient documentation

## 2024-06-02 DIAGNOSIS — M12811 Other specific arthropathies, not elsewhere classified, right shoulder: Secondary | ICD-10-CM

## 2024-06-02 DIAGNOSIS — F17210 Nicotine dependence, cigarettes, uncomplicated: Secondary | ICD-10-CM | POA: Insufficient documentation

## 2024-06-02 DIAGNOSIS — I10 Essential (primary) hypertension: Secondary | ICD-10-CM | POA: Insufficient documentation

## 2024-06-02 DIAGNOSIS — M1990 Unspecified osteoarthritis, unspecified site: Secondary | ICD-10-CM | POA: Insufficient documentation

## 2024-06-02 HISTORY — PX: REVERSE SHOULDER ARTHROPLASTY: SHX5054

## 2024-06-02 SURGERY — ARTHROPLASTY, SHOULDER, TOTAL, REVERSE
Anesthesia: Regional | Site: Shoulder | Laterality: Right

## 2024-06-02 MED ORDER — METHOCARBAMOL 500 MG PO TABS
ORAL_TABLET | ORAL | Status: AC
  Filled 2024-06-02: qty 1

## 2024-06-02 MED ORDER — ACETAMINOPHEN 10 MG/ML IV SOLN
1000.0000 mg | Freq: Four times a day (QID) | INTRAVENOUS | Status: DC
  Administered 2024-06-02: 1000 mg via INTRAVENOUS

## 2024-06-02 MED ORDER — BUPIVACAINE LIPOSOME 1.3 % IJ SUSP
INTRAMUSCULAR | Status: DC | PRN
Start: 2024-06-02 — End: 2024-06-02
  Administered 2024-06-02: 10 mL via PERINEURAL

## 2024-06-02 MED ORDER — CHLORHEXIDINE GLUCONATE 0.12 % MT SOLN
15.0000 mL | Freq: Once | OROMUCOSAL | Status: AC
  Administered 2024-06-02: 15 mL via OROMUCOSAL

## 2024-06-02 MED ORDER — BUPIVACAINE-EPINEPHRINE (PF) 0.25% -1:200000 IJ SOLN
INTRAMUSCULAR | Status: DC | PRN
  Administered 2024-06-02: 13 mL

## 2024-06-02 MED ORDER — LIDOCAINE 2% (20 MG/ML) 5 ML SYRINGE
INTRAMUSCULAR | Status: DC | PRN
Start: 2024-06-02 — End: 2024-06-02
  Administered 2024-06-02: 80 mg via INTRAVENOUS

## 2024-06-02 MED ORDER — FENTANYL CITRATE (PF) 100 MCG/2ML IJ SOLN
INTRAMUSCULAR | Status: DC | PRN
  Administered 2024-06-02 (×2): 50 ug via INTRAVENOUS

## 2024-06-02 MED ORDER — PROPOFOL 10 MG/ML IV BOLUS
INTRAVENOUS | Status: DC | PRN
  Administered 2024-06-02: 160 mg via INTRAVENOUS

## 2024-06-02 MED ORDER — ROCURONIUM BROMIDE 10 MG/ML (PF) SYRINGE
PREFILLED_SYRINGE | INTRAVENOUS | Status: DC | PRN
  Administered 2024-06-02: 60 mg via INTRAVENOUS

## 2024-06-02 MED ORDER — ORAL CARE MOUTH RINSE: 15.0000 mL | Freq: Once | OROMUCOSAL | Status: AC

## 2024-06-02 MED ORDER — HYDROMORPHONE HCL 1 MG/ML IJ SOLN: 0.2500 mg | INTRAMUSCULAR | Status: DC | PRN

## 2024-06-02 MED ORDER — 0.9 % SODIUM CHLORIDE (POUR BTL) OPTIME
TOPICAL | Status: DC | PRN
  Administered 2024-06-02: 1000 mL

## 2024-06-02 MED ORDER — ACETAMINOPHEN 10 MG/ML IV SOLN
INTRAVENOUS | Status: AC
  Filled 2024-06-02: qty 100

## 2024-06-02 MED ORDER — TRANEXAMIC ACID-NACL 1000-0.7 MG/100ML-% IV SOLN
1000.0000 mg | INTRAVENOUS | Status: AC
  Administered 2024-06-02: 1000 mg via INTRAVENOUS
  Filled 2024-06-02: qty 100

## 2024-06-02 MED ORDER — PHENYLEPHRINE 80 MCG/ML (10ML) SYRINGE FOR IV PUSH (FOR BLOOD PRESSURE SUPPORT)
PREFILLED_SYRINGE | INTRAVENOUS | Status: DC | PRN
  Administered 2024-06-02: 80 ug via INTRAVENOUS

## 2024-06-02 MED ORDER — PHENYLEPHRINE 80 MCG/ML (10ML) SYRINGE FOR IV PUSH (FOR BLOOD PRESSURE SUPPORT)
PREFILLED_SYRINGE | INTRAVENOUS | Status: AC
  Filled 2024-06-02: qty 10

## 2024-06-02 MED ORDER — DROPERIDOL 2.5 MG/ML IJ SOLN: 0.6250 mg | Freq: Once | INTRAMUSCULAR | Status: DC | PRN

## 2024-06-02 MED ORDER — TRAMADOL HCL 50 MG PO TABS: 50.0000 mg | ORAL_TABLET | Freq: Four times a day (QID) | ORAL | 0 refills | Status: AC | PRN

## 2024-06-02 MED ORDER — LACTATED RINGERS IV SOLN: INTRAVENOUS | Status: DC

## 2024-06-02 MED ORDER — TRANEXAMIC ACID-NACL 1000-0.7 MG/100ML-% IV SOLN: 1000.0000 mg | Freq: Once | INTRAVENOUS | Status: DC

## 2024-06-02 MED ORDER — CEFAZOLIN SODIUM-DEXTROSE 3-4 GM/150ML-% IV SOLN
3.0000 g | INTRAVENOUS | Status: AC
  Administered 2024-06-02: 3 g via INTRAVENOUS
  Filled 2024-06-02: qty 150

## 2024-06-02 MED ORDER — MIDAZOLAM HCL 5 MG/5ML IJ SOLN
INTRAMUSCULAR | Status: DC | PRN
  Administered 2024-06-02: 2 mg via INTRAVENOUS

## 2024-06-02 MED ORDER — MIDAZOLAM HCL 2 MG/2ML IJ SOLN
INTRAMUSCULAR | Status: AC
  Filled 2024-06-02: qty 2

## 2024-06-02 MED ORDER — LIDOCAINE HCL (PF) 2 % IJ SOLN
INTRAMUSCULAR | Status: AC
  Filled 2024-06-02: qty 5

## 2024-06-02 MED ORDER — METHOCARBAMOL 500 MG PO TABS
500.0000 mg | ORAL_TABLET | Freq: Four times a day (QID) | ORAL | Status: DC | PRN
Start: 2024-06-02 — End: 2024-06-02
  Administered 2024-06-02: 500 mg via ORAL

## 2024-06-02 MED ORDER — SUGAMMADEX SODIUM 200 MG/2ML IV SOLN
INTRAVENOUS | Status: DC | PRN
Start: 2024-06-02 — End: 2024-06-02
  Administered 2024-06-02: 200 mg via INTRAVENOUS

## 2024-06-02 MED ORDER — ROCURONIUM BROMIDE 10 MG/ML (PF) SYRINGE
PREFILLED_SYRINGE | INTRAVENOUS | Status: AC
  Filled 2024-06-02: qty 10

## 2024-06-02 MED ORDER — ONDANSETRON HCL 4 MG PO TABS: 4.0000 mg | ORAL_TABLET | Freq: Three times a day (TID) | ORAL | 1 refills | Status: AC | PRN

## 2024-06-02 MED ORDER — BUPIVACAINE HCL (PF) 0.5 % IJ SOLN
INTRAMUSCULAR | Status: DC | PRN
Start: 2024-06-02 — End: 2024-06-02
  Administered 2024-06-02: 10 mL via PERINEURAL

## 2024-06-02 MED ORDER — CELECOXIB 200 MG PO CAPS: 200.0000 mg | ORAL_CAPSULE | Freq: Once | ORAL | Status: DC

## 2024-06-02 MED ORDER — PROPOFOL 10 MG/ML IV BOLUS
INTRAVENOUS | Status: AC
  Filled 2024-06-02: qty 20

## 2024-06-02 MED ORDER — ONDANSETRON HCL 4 MG/2ML IJ SOLN
INTRAMUSCULAR | Status: AC
  Filled 2024-06-02: qty 2

## 2024-06-02 MED ORDER — METHOCARBAMOL 500 MG PO TABS: 500.0000 mg | ORAL_TABLET | Freq: Three times a day (TID) | ORAL | 1 refills | Status: AC | PRN

## 2024-06-02 MED ORDER — EPHEDRINE SULFATE-NACL 50-0.9 MG/10ML-% IV SOSY
PREFILLED_SYRINGE | INTRAVENOUS | Status: DC | PRN
  Administered 2024-06-02 (×2): 5 mg via INTRAVENOUS

## 2024-06-02 MED ORDER — ONDANSETRON HCL 4 MG/2ML IJ SOLN
INTRAMUSCULAR | Status: DC | PRN
  Administered 2024-06-02: 4 mg via INTRAVENOUS

## 2024-06-02 MED ORDER — FENTANYL CITRATE (PF) 100 MCG/2ML IJ SOLN
INTRAMUSCULAR | Status: AC
  Filled 2024-06-02: qty 2

## 2024-06-02 MED ORDER — METHOCARBAMOL 1000 MG/10ML IJ SOLN: 500.0000 mg | Freq: Four times a day (QID) | INTRAMUSCULAR | Status: DC | PRN

## 2024-06-02 MED ORDER — STERILE WATER FOR IRRIGATION IR SOLN
Status: DC | PRN
  Administered 2024-06-02: 2000 mL

## 2024-06-02 MED ORDER — BUPIVACAINE-EPINEPHRINE (PF) 0.25% -1:200000 IJ SOLN
INTRAMUSCULAR | Status: AC
  Filled 2024-06-02: qty 30

## 2024-06-02 MED ORDER — PHENYLEPHRINE HCL-NACL 20-0.9 MG/250ML-% IV SOLN
INTRAVENOUS | Status: DC | PRN
  Administered 2024-06-02: 25 ug/min via INTRAVENOUS

## 2024-06-02 MED ORDER — ACETAMINOPHEN 500 MG PO TABS: 1000.0000 mg | ORAL_TABLET | Freq: Once | ORAL | Status: DC

## 2024-06-02 SURGICAL SUPPLY — 62 items
BAG COUNTER SPONGE SURGICOUNT (BAG) IMPLANT
BAG ZIPLOCK 12X15 (MISCELLANEOUS) IMPLANT
BIT DRILL 1.6MX128 (BIT) IMPLANT
BIT DRILL 170X2.5X (BIT) IMPLANT
BLADE SAG 18X100X1.27 (BLADE) ×1 IMPLANT
COVER BACK TABLE 60X90IN (DRAPES) ×1 IMPLANT
COVER SURGICAL LIGHT HANDLE (MISCELLANEOUS) ×1 IMPLANT
DRAPE INCISE IOBAN 66X45 STRL (DRAPES) ×1 IMPLANT
DRAPE SHEET LG 3/4 BI-LAMINATE (DRAPES) ×1 IMPLANT
DRAPE SURG ORHT 6 SPLT 77X108 (DRAPES) ×2 IMPLANT
DRAPE TOP 10253 STERILE (DRAPES) ×1 IMPLANT
DRAPE U-SHAPE 47X51 STRL (DRAPES) ×1 IMPLANT
DRSG ADAPTIC 3X8 NADH LF (GAUZE/BANDAGES/DRESSINGS) ×1 IMPLANT
DRSG AQUACEL AG ADV 3.5X10 (GAUZE/BANDAGES/DRESSINGS) ×1 IMPLANT
DURAPREP 26ML APPLICATOR (WOUND CARE) ×1 IMPLANT
ELECT BLADE TIP CTD 4 INCH (ELECTRODE) ×1 IMPLANT
ELECT NDL TIP 2.8 STRL (NEEDLE) ×1 IMPLANT
ELECT NEEDLE TIP 2.8 STRL (NEEDLE) ×1 IMPLANT
ELECT PENCIL ROCKER SW 15FT (MISCELLANEOUS) ×1 IMPLANT
ELECT REM PT RETURN 15FT ADLT (MISCELLANEOUS) ×1 IMPLANT
EPIPHYSIS LT SZ 1 (Orthopedic Implant) IMPLANT
FACESHIELD WRAPAROUND (MASK) ×1 IMPLANT
FACESHIELD WRAPAROUND OR TEAM (MASK) ×1 IMPLANT
GAUZE PAD ABD 8X10 STRL (GAUZE/BANDAGES/DRESSINGS) ×1 IMPLANT
GAUZE SPONGE 4X4 12PLY STRL (GAUZE/BANDAGES/DRESSINGS) ×1 IMPLANT
GLENOSPHERE DELTA XTEND LAT 38 (Miscellaneous) IMPLANT
GLOVE BIOGEL PI IND STRL 7.5 (GLOVE) ×1 IMPLANT
GLOVE BIOGEL PI IND STRL 8.5 (GLOVE) ×1 IMPLANT
GLOVE ORTHO TXT STRL SZ7.5 (GLOVE) ×1 IMPLANT
GLOVE SURG ORTHO 8.5 STRL (GLOVE) ×1 IMPLANT
GOWN STRL REUS W/ TWL XL LVL3 (GOWN DISPOSABLE) ×2 IMPLANT
KIT BASIN OR (CUSTOM PROCEDURE TRAY) ×1 IMPLANT
KIT TURNOVER KIT A (KITS) ×1 IMPLANT
MANIFOLD NEPTUNE II (INSTRUMENTS) ×1 IMPLANT
METAGLENE DELTA EXTEND (Trauma) IMPLANT
NDL MAYO 6 CRC TAPER PT (NEEDLE) IMPLANT
NDL MAYO CATGUT SZ4 TPR NDL (NEEDLE) IMPLANT
NEEDLE MAYO 6 CRC TAPER PT (NEEDLE) ×1 IMPLANT
NEEDLE MAYO CATGUT SZ4 (NEEDLE) IMPLANT
NS IRRIG 1000ML POUR BTL (IV SOLUTION) ×1 IMPLANT
PACK SHOULDER (CUSTOM PROCEDURE TRAY) ×1 IMPLANT
PIN GUIDE 1.2 (PIN) IMPLANT
PIN GUIDE GLENOPHERE 1.5MX300M (PIN) IMPLANT
PIN METAGLENE 2.5 (PIN) IMPLANT
RESTRAINT HEAD UNIVERSAL NS (MISCELLANEOUS) ×1 IMPLANT
SCREW 4.5X36MM (Screw) IMPLANT
SCREW BN 18X4.5XSTRL SHLDR (Screw) IMPLANT
SCREW LOCK 42 (Screw) IMPLANT
SLING ARM FOAM STRAP LRG (SOFTGOODS) IMPLANT
SPACER 38 PLUS 3 (Spacer) IMPLANT
SPIKE FLUID TRANSFER (MISCELLANEOUS) ×1 IMPLANT
STEM DELTA DIA 10 HA (Stem) IMPLANT
STRIP CLOSURE SKIN 1/2X4 (GAUZE/BANDAGES/DRESSINGS) ×1 IMPLANT
SUT MNCRL AB 4-0 PS2 18 (SUTURE) ×1 IMPLANT
SUT VIC AB 0 CT1 36 (SUTURE) ×1 IMPLANT
SUT VIC AB 0 CT2 27 (SUTURE) ×1 IMPLANT
SUT VIC AB 2-0 CT1 TAPERPNT 27 (SUTURE) ×1 IMPLANT
SUTURE FIBERWR #2 38 T-5 BLUE (SUTURE) ×2 IMPLANT
TAPE CLOTH SURG 6X10 NS LF (GAUZE/BANDAGES/DRESSINGS) IMPLANT
TOWEL GREEN STERILE FF (TOWEL DISPOSABLE) ×1 IMPLANT
TOWEL OR 17X26 10 PK STRL BLUE (TOWEL DISPOSABLE) ×1 IMPLANT
YANKAUER SUCT BULB TIP NO VENT (SUCTIONS) ×1 IMPLANT

## 2024-06-02 NOTE — Op Note (Signed)
 NAME: Tara Gregory, Tara Gregory MEDICAL RECORD NO: [id number] ACCOUNT NO: [id number] DATE OF BIRTH: 1961/04/30 FACILITY: [facility] LOCATION: [facility] PHYSICIAN: Loreta Rome. Brunilda Capra, MD  Operative Report   DATE OF PROCEDURE: 06/02/2024  PREOPERATIVE DIAGNOSIS:  Right shoulder rotator cuff tear arthropathy.  POSTOPERATIVE DIAGNOSIS:  Right shoulder rotator cuff tear arthropathy.  PROCEDURE PERFORMED:  Right reverse total shoulder arthroplasty using DePuy Delta Xtend prosthesis with subscapularis repair.  ATTENDING SURGEON:  Loreta Rome. Brunilda Capra, MD  ASSISTANT:  Barton Like Dixon, [place], who was scrubbed during the entire procedure, and necessary for satisfactory completion of surgery.  ESTIMATED BLOOD LOSS:  200 mL.  FLUID REPLACEMENT:  1500 mL of crystalloid.  INSTRUMENT COUNTS:  Correct.  COMPLICATIONS:  None.  ANTIBIOTICS:  Perioperative antibiotics were given.  INDICATIONS:  The patient is a 63 year old female with worsening right shoulder pain secondary to end-stage arthritis and rotator cuff insufficiency.  The patient presents for reverse total shoulder arthroplasty to eliminate pain and to restore function.   Informed consent obtained.  DESCRIPTION OF PROCEDURE:  After an adequate level of anesthesia was achieved, the patient was positioned in the modified beach chair position.  The right shoulder was correctly identified and sterile prep and drape performed.  Timeout called verifying  correct patient and correct site.  We entered the patient's shoulder using a standard deltopectoral approach starting at the coracoid process and then extending down to the anterior humerus using a 10 blade scalpel.  Dissection down through subcutaneous  tissues using Bovie.  Cephalic vein identified and taken laterally with the deltoid.  Pectoralis taken medially.  The conjoined tendon was identified and retracted medially.  Deep retractors were placed.  We tenodesed the biceps in situ with 0 Vicryl  figure-of-eight  suture x2.  We then released the subscapularis subperiosteally off the lesser tuberosity and tagged with #2 FiberWire suture in a modified Mason-Allen suture technique for repair.  We released the inferior capsule.  We extended the  shoulder delivering the humeral head out of the wound.  Extensive arthritis was noted with bone-on-bone.  We entered the proximal humerus with a 6-mm reamer.  The canal was relatively posterior to the head, which was interesting for the shoulder, which  was opposite of normal.  Once we had our entry reamer in, we reamed up to a size 10 mm.  We then placed a 10-mm T-handle guide and resected the head at 20 degrees of retroversion at the appropriate height with the oscillating saw.  We removed excess  osteophytes with a rongeur.  We then subluxed the humerus posteriorly, gaining good exposure to the glenoid.  We removed the capsule and the labrum, careful to protect the axillary nerve.  We then removed the remaining cartilage on the glenoid.  We  placed our deep retractors.  I found the center point for the guide pin, placed that bicortically, and then reamed the subchondral bone for the metaglene baseplate.  We did peripheral hand reaming with a T-handle reamer.  Next, we went ahead and drilled  out our central peg hole.  We then impacted the HA-coated press-fit baseplate in position.  We placed a 42 screw inferiorly, a 36 screw superiorly at the base of the coracoid, and an 18 nonlocked screw posteriorly.  We locked superior and inferior  screws.  The baseplate was secured and the screw purchase was excellent.  We then selected the 38 +0 standard glenosphere and attached that to the baseplate with the screwdriver.  Once that was done, I did  a finger sweep to make sure we had no soft  tissue caught up between the baseplate and there was adequate room for the humeral tray to slide underneath that glenosphere.  Once we had verified that, I irrigated thoroughly.  We then went to the  humeral side and reamed for the metaphysis and we wind  up turning the eccentricity anteriorly and then reaming and wind up selecting the one left metaphysis for this right shoulder.  We then trialled with the 10 stem and the one left metaphysis.  It fit perfectly for the patient.  We reduced with a 38 +3  poly trial.  We were happy with that soft tissue balancing and tension on the shoulder.  No instability noted.  We removed all trial components from the humeral side.  We drilled holes for placement of suture for repair of the subscapularis.  We  irrigated thoroughly and then used available bone graft with the HA-coated press-fit 10 stem and the one left for this right shoulder metaphysis set in the 0 setting.  We used available bone graft with impaction grafting technique and impacted the  HA-coated prosthesis into place.  Once the stem was secured, we selected the real 38 +3 poly, placed on the humeral tray and impacted that.  Reduced the shoulder.  Nice little pop as it reduced appropriate tension on the conjoined tendon.  Good stability  throughout a full arc of motion.  No gapping with inferior pole or external rotation.  We irrigated thoroughly.  We then repaired the subscapularis anatomically back to bone.  This did not restrict motion at all and should enhance stability and  strength.  We irrigated again and repaired deltopectoral interval with 0 Vicryl suture followed by 2-0 Vicryl for subcutaneous closure and 4-0 Monocryl for skin.  Steri-Strips were applied followed by a sterile dressing.  The patient tolerated surgery  well.   PUS D: 06/02/2024 9:28:22 am T: 06/02/2024 10:30:00 am  JOB: [id number]/ [id number]

## 2024-06-02 NOTE — Transfer of Care (Signed)
 Immediate Anesthesia Transfer of Care Note  Patient: Tara Gregory  Procedure(s) Performed: ARTHROPLASTY, SHOULDER, TOTAL, REVERSE (Right: Shoulder)  Patient Location: PACU  Anesthesia Type:General and Regional  Level of Consciousness: awake, alert , and oriented  Airway & Oxygen Therapy: Patient Spontanous Breathing and Patient connected to face mask oxygen  Post-op Assessment: Report given to RN and Post -op Vital signs reviewed and stable  Post vital signs: Reviewed and stable  Last Vitals:  Vitals Value Taken Time  BP 147/73 06/02/24 0927  Temp    Pulse 71 06/02/24 0929  Resp 18 06/02/24 0929  SpO2 92 % 06/02/24 0929  Vitals shown include unfiled device data.  Last Pain:  Vitals:   06/02/24 0629  TempSrc:   PainSc: 5          Complications: No notable events documented.

## 2024-06-02 NOTE — Evaluation (Signed)
 Occupational Therapy Evaluation Patient Details Name: Tara Gregory MRN: [id number] DOB: Aug 25, 1961 Today's Date: 06/02/2024   History of Present Illness   Tara Gregory is a 63 yo female s/p Right [place] TSA. PMH: OA     Clinical Impressions PTA pt lives at home with husband and was independently amb, completing self care, working and driving prior to surgery this am.  Education completed regarding compensatory strategies for ADL tasks and functional mobility, management of sling, R ROM per specified parameters in the order set as indicated below, positioning of operative arm in sitting and supine and edema control, including use of  regular ice packs for Cold Therapy. Caregiver present for education, written handouts provided and reviewed using Teach Back and pt/caregiver verbalized/demonstrated understanding. Due to the below listed deficits, pt requires min A assistance with ADL tasks and mod I on level surfaces assist with functional mobility. Caregiver will be able to provide necessary level of assistance at discharge. Pt to follow up with MD to progress rehab of the operative shoulder.      If plan is discharge home, recommend the following:   A little help with walking and/or transfers;Assistance with cooking/housework;A little help with bathing/dressing/bathroom;Assist for transportation;Help with stairs or ramp for entrance     Functional Status Assessment   Patient has had a recent decline in their functional status and demonstrates the ability to make significant improvements in function in a reasonable and predictable amount of time.     Equipment Recommendations   None recommended by OT      Precautions/Restrictions   Precautions Precautions: Shoulder Required Braces or Orthoses: Sling Restrictions Weight Bearing Restrictions Per Provider Order: Yes     Mobility Bed Mobility Overal bed mobility:  (up in relciner for session but med mobility education  completed)                  Transfers Overall transfer level: Modified independent                 General transfer comment: sling      Balance Overall balance assessment: No apparent balance deficits (not formally assessed)                                         ADL either performed or assessed with clinical judgement   ADL Overall ADL's : Needs assistance/impaired      Per orders, R shoulder parameters as follows for ADL tasks: No shoulder ROM; elbow/wrist/hand ROM only. While moving within specified parameters, pt/caregiver instructed on bathing and how to donn/doff shirt, placing operative arm through sleeve first when donning and off last when doffing.Pt/caregiver educated on compensatory strategies for LB ADL and strategies to reduce risk of falls.  Pt/caregiver educated on donning/doffing sling and to wear the sling at all times with the exception of ADL, and to loosen the neck strap of the sling when the operative arm is in a supported position when sitting. In sitting or supine, pt instructed to have a pillow behind and under their operative arm to provide support. If assist needed with ambulation, caregiver educated on the importance of walking on pt's non-operative side.  Education regarding use of ice packs completed, including the importance of using a barrier on the shoulder prior to positioning the wrap-on pad. Pt/caregiver verbalized/demonstrated understanding. Teach Back used while caregiver assisted with dressing pt and positioning ice packs  to facilitate DC.                                        Vision Baseline Vision/History: 0 No visual deficits Ability to See in Adequate Light: 0 Adequate Patient Visual Report: No change from baseline              Pertinent Vitals/Pain Pain Assessment Pain Assessment: No/denies pain     Extremity/Trunk Assessment Upper Extremity Assessment Upper Extremity Assessment: Right  hand dominant;RUE deficits/detail RUE Deficits / Details: R surgical n block remains active but R elbow, wrist and hand AAROM WFL's   Lower Extremity Assessment Lower Extremity Assessment: Overall WFL for tasks assessed   Cervical / Trunk Assessment Cervical / Trunk Assessment: Normal   Communication Communication Communication: No apparent difficulties   Cognition Arousal: Alert, Suspect due to medications Behavior During Therapy: WFL for tasks assessed/performed Cognition: No apparent impairments                               Following commands: Intact       Cueing  General Comments   Cueing Techniques: Verbal cues  R shoulder post op dressing in place and intact   Exercises Exercises: Shoulder Shoulder Exercises Elbow Flexion: AAROM, 10 reps Elbow Extension: AAROM, 10 reps Wrist Flexion: AAROM, 10 reps Wrist Extension: AAROM, 10 reps Digit Composite Flexion: AAROM, 10 reps Composite Extension: AAROM, 10 reps   Shoulder Instructions Shoulder Instructions Donning/doffing shirt without moving shoulder: Caregiver independent with task;Patient able to independently direct caregiver Method for sponge bathing under operated UE: Caregiver independent with task;Patient able to independently direct caregiver Donning/doffing sling/immobilizer: Caregiver independent with task;Patient able to independently direct caregiver Correct positioning of sling/immobilizer: Caregiver independent with task;Patient able to independently direct caregiver ROM for elbow, wrist and digits of operated UE: Caregiver independent with task;Patient able to independently direct caregiver Sling wearing schedule (on at all times/off for ADL's): Caregiver independent with task;Patient able to independently direct caregiver Proper positioning of operated UE when showering: Caregiver independent with task;Patient able to independently direct caregiver Positioning of UE while sleeping: Caregiver  independent with task;Patient able to independently direct caregiver    Home Living Family/patient expects to be discharged to:: Private residence Living Arrangements: Spouse/significant other Available Help at Discharge: Family;Available 24 hours/day Type of Home: House Home Access: Stairs to enter [employer] of Steps: 3 Entrance Stairs-Rails: Right Home Layout: One level     Bathroom Shower/Tub: IT trainer: Standard Bathroom Accessibility: Yes How Accessible: Accessible via walker Home Equipment: Shower seat;Cane - single point;Adaptive equipment Adaptive Equipment: Reacher        Prior Functioning/Environment Prior Level of Function : Independent/Modified Independent;Driving;Working/employed                    OT Problem List: Impaired UE functional use    AM-PAC OT "6 Clicks" Daily Activity     Outcome Measure Help from another person eating meals?: None Help from another person taking care of personal grooming?: None Help from another person toileting, which includes using toliet, bedpan, or urinal?: A Little Help from another person bathing (including washing, rinsing, drying)?: A Little Help from another person to put on and taking off regular upper body clothing?: A Little Help from another person to put on and taking off regular lower  body clothing?: A Little 6 Click Score: 20   End of Session Equipment Utilized During Treatment: Gait belt Nurse Communication: Mobility status  Activity Tolerance: Patient tolerated treatment well Patient left: in chair;with call bell/phone within reach;with family/visitor present  OT Visit Diagnosis: Other (comment) (R UE dysfunction)                Time: 1610-9604 OT Time Calculation (min): 40 min Charges:  OT General Charges $OT Visit: 1 Visit OT Evaluation $OT Eval Low Complexity: 1 Low OT Treatments $Self Care/Home Management : 23-37 mins  Tanishia Lemaster OT/L Acute  Rehabilitation Department  [phone]  06/02/2024, 1:45 PM

## 2024-06-02 NOTE — Anesthesia Procedure Notes (Signed)
 Anesthesia Regional Block: Interscalene brachial plexus block   Pre-Anesthetic Checklist: , timeout performed,  Correct Patient, Correct Site, Correct Laterality,  Correct Procedure, Correct Position, site marked,  Risks and benefits discussed,  Surgical consent,  Pre-op evaluation,  At surgeon's request and post-op pain management  Laterality: Right  Prep: Dura Prep       Needles:  Injection technique: Single-shot  Needle Type: Echogenic Stimulator Needle     Needle Length: 5cm  Needle Gauge: 20     Additional Needles:   Procedures:,,,, ultrasound used (permanent image in chart),,    Narrative:  Start time: 06/02/2024 6:55 AM End time: 06/02/2024 7:06 AM Injection made incrementally with aspirations every 5 mL.  Performed by: Personally  Anesthesiologist: Micheal Agent, DO  Additional Notes: Patient identified. Risks/Benefits/Options discussed with patient including but not limited to bleeding, infection, nerve damage, failed block, incomplete pain control. Patient expressed understanding and wished to proceed. All questions were answered. Sterile technique was used throughout the entire procedure. Please see nursing notes for vital signs. Aspirated in 5cc intervals with injection for negative confirmation. Patient was given instructions on fall risk and not to get out of bed. All questions and concerns addressed with instructions to call with any issues or inadequate analgesia.

## 2024-06-02 NOTE — Anesthesia Preprocedure Evaluation (Addendum)
 Anesthesia Evaluation  Patient identified by MRN, date of birth, ID band Patient awake    Reviewed: Allergy & Precautions, NPO status , Patient's Chart, lab work & pertinent test results  History of Anesthesia Complications (+) PONV and history of anesthetic complications  Airway Mallampati: II  TM Distance: >3 FB Neck ROM: Full    Dental no notable dental hx.    Pulmonary Current Smoker   Pulmonary exam normal        Cardiovascular hypertension, Pt. on medications  Rhythm:Regular Rate:Normal     Neuro/Psych negative neurological ROS  negative psych ROS   GI/Hepatic negative GI ROS, Neg liver ROS,,,  Endo/Other  negative endocrine ROS    Renal/GU negative Renal ROS  negative genitourinary   Musculoskeletal  (+) Arthritis , Osteoarthritis,    Abdominal Normal abdominal exam  (+)   Peds  Hematology Lab Results      Component                Value               Date                      WBC                      11.6 (H)            05/26/2024                HGB                      14.0                05/26/2024                HCT                      43.3                05/26/2024                MCV                      96.2                05/26/2024                PLT                      293                 05/26/2024              Anesthesia Other Findings   Reproductive/Obstetrics                             Anesthesia Physical Anesthesia Plan  ASA: 2  Anesthesia Plan: General and Regional   Post-op Pain Management: Regional block*, Tylenol PO (pre-op)* and Celebrex PO (pre-op)*   Induction: Intravenous  PONV Risk Score and Plan: 3 and Ondansetron, Dexamethasone, Midazolam, Treatment may vary due to age or medical condition and Droperidol  Airway Management Planned: Mask and Oral ETT  Additional Equipment: None  Intra-op Plan:   Post-operative Plan: Extubation in  OR  Informed Consent: I have reviewed the patients History and Physical, chart, labs and discussed  the procedure including the risks, benefits and alternatives for the proposed anesthesia with the patient or authorized representative who has indicated his/her understanding and acceptance.     Dental advisory given  Plan Discussed with: CRNA  Anesthesia Plan Comments:        Anesthesia Quick Evaluation

## 2024-06-02 NOTE — Discharge Instructions (Signed)
 Ice to the shoulder constantly.  Keep the incision covered and clean and dry for one week, then ok to get it wet in the shower. Change to Aquacel bandage on Sunday or Monday and leave on next week until Friday then remove and leave open to air  Do exercise as instructed several times per day.  DO NOT reach behind your back or push up out of a chair with the operative arm.  Use a sling while you are up and around for comfort, may remove while seated.  Keep pillow propped behind the operative elbow.  Follow up with Dr Brunilda Capra in two weeks in the office, call [phone] for appt  Please call Dr Brunilda Capra (cell) [phone] with any questions or concerns

## 2024-06-02 NOTE — Brief Op Note (Signed)
 06/02/2024  9:23 AM  PATIENT:  Tara Gregory  63 y.o. female  PRE-OPERATIVE DIAGNOSIS:  Right shoulder rotator cuff arthropathy  POST-OPERATIVE DIAGNOSIS:  Right shoulder rotator cuff arthropathy  PROCEDURE:  Procedure(s): ARTHROPLASTY, SHOULDER, TOTAL, REVERSE (Right) DePuy Delta Xtend with Subscap repair  SURGEON:  Surgeons and Role:    Winston Hawking, MD - Primary  PHYSICIAN ASSISTANT:   ASSISTANTS: Corinthia Dickinson, Pa-C   ANESTHESIA:   regional and general  EBL:  200 cc  BLOOD ADMINISTERED:none  DRAINS: none   LOCAL MEDICATIONS USED:  MARCAINE     SPECIMEN:  No Specimen  DISPOSITION OF SPECIMEN:  N/A  COUNTS:  YES  TOURNIQUET:  * No tourniquets in log *  DICTATION: .Other Dictation: Dictation Number [id number]  PLAN OF CARE: Discharge to home after PACU  PATIENT DISPOSITION:  PACU - hemodynamically stable.   Delay start of Pharmacological VTE agent (>24hrs) due to surgical blood loss or risk of bleeding: not applicable

## 2024-06-02 NOTE — Interval H&P Note (Signed)
 History and Physical Interval Note:  06/02/2024 7:21 AM  Tara Gregory  has presented today for surgery, with the diagnosis of Right shoulder rotator cuff arthropathy.  The various methods of treatment have been discussed with the patient and family. After consideration of risks, benefits and other options for treatment, the patient has consented to  Procedure(s): ARTHROPLASTY, SHOULDER, TOTAL, REVERSE (Right) as a surgical intervention.  The patient's history has been reviewed, patient examined, no change in status, stable for surgery.  I have reviewed the patient's chart and labs.  Questions were answered to the patient's satisfaction.     Lorriane Rote

## 2024-06-02 NOTE — Anesthesia Procedure Notes (Signed)
 Procedure Name: Intubation Date/Time: 06/02/2024 7:42 AM  Performed by: Eliyohu Class D, CRNAPre-anesthesia Checklist: Patient identified, Emergency Drugs available, Suction available and Patient being monitored Patient Re-evaluated:Patient Re-evaluated prior to induction Oxygen Delivery Method: Circle system utilized Preoxygenation: Pre-oxygenation with 100% oxygen Induction Type: IV induction Ventilation: Mask ventilation without difficulty Laryngoscope Size: Mac and 4 Grade View: Grade II Tube type: Oral Tube size: 7.0 mm Number of attempts: 1 Airway Equipment and Method: Stylet and Oral airway Placement Confirmation: ETT inserted through vocal cords under direct vision, positive ETCO2 and breath sounds checked- equal and bilateral Secured at: 22 cm Tube secured with: Tape Dental Injury: Teeth and Oropharynx as per pre-operative assessment

## 2024-06-02 NOTE — Anesthesia Postprocedure Evaluation (Signed)
 Anesthesia Post Note  Patient: Tara Gregory  Procedure(s) Performed: ARTHROPLASTY, SHOULDER, TOTAL, REVERSE (Right: Shoulder)     Patient location during evaluation: PACU Anesthesia Type: Regional and General Level of consciousness: awake and alert Pain management: pain level controlled Vital Signs Assessment: post-procedure vital signs reviewed and stable Respiratory status: spontaneous breathing, nonlabored ventilation, respiratory function stable and patient connected to nasal cannula oxygen Cardiovascular status: blood pressure returned to baseline and stable Postop Assessment: no apparent nausea or vomiting Anesthetic complications: no   No notable events documented.  Last Vitals:  Vitals:   06/02/24 1045 06/02/24 1051  BP:  119/85  Pulse: (!) 58 66  Resp: 20 16  Temp:  36.8 C  SpO2: 93% 94%    Last Pain:  Vitals:   06/02/24 1051  TempSrc: Oral  PainSc: 0-No pain                 Theotis Flake P Mckinsley Koelzer

## 2024-06-03 ENCOUNTER — Encounter (HOSPITAL_COMMUNITY): Payer: Self-pay | Admitting: Orthopedic Surgery
